# Patient Record
Sex: Female | Born: 1960 | Race: Black or African American | Hispanic: No | Marital: Single
Health system: Southern US, Community
[De-identification: ages and names within clinical notes are randomized; demographics above are authoritative.]

## PROBLEM LIST (undated history)

## (undated) DIAGNOSIS — I10 Essential (primary) hypertension: Secondary | ICD-10-CM

## (undated) DIAGNOSIS — J449 Chronic obstructive pulmonary disease, unspecified: Secondary | ICD-10-CM

## (undated) DIAGNOSIS — E7800 Pure hypercholesterolemia, unspecified: Secondary | ICD-10-CM

## (undated) HISTORY — PX: TOTAL HIP ARTHROPLASTY: SHX124

---

## 2017-02-13 ENCOUNTER — Other Ambulatory Visit: Payer: Self-pay

## 2017-02-13 ENCOUNTER — Encounter (HOSPITAL_COMMUNITY): Payer: Self-pay | Admitting: Emergency Medicine

## 2017-02-13 ENCOUNTER — Emergency Department (HOSPITAL_COMMUNITY): Payer: Medicaid - Out of State

## 2017-02-13 DIAGNOSIS — Z7982 Long term (current) use of aspirin: Secondary | ICD-10-CM | POA: Diagnosis not present

## 2017-02-13 DIAGNOSIS — Z79899 Other long term (current) drug therapy: Secondary | ICD-10-CM | POA: Insufficient documentation

## 2017-02-13 DIAGNOSIS — R072 Precordial pain: Secondary | ICD-10-CM | POA: Insufficient documentation

## 2017-02-13 DIAGNOSIS — J449 Chronic obstructive pulmonary disease, unspecified: Secondary | ICD-10-CM | POA: Diagnosis not present

## 2017-02-13 DIAGNOSIS — K210 Gastro-esophageal reflux disease with esophagitis: Secondary | ICD-10-CM | POA: Diagnosis not present

## 2017-02-13 DIAGNOSIS — I10 Essential (primary) hypertension: Secondary | ICD-10-CM | POA: Insufficient documentation

## 2017-02-13 LAB — BASIC METABOLIC PANEL
ANION GAP: 10 (ref 5–15)
BUN: 11 mg/dL (ref 6–20)
CALCIUM: 8.8 mg/dL — AB (ref 8.9–10.3)
CHLORIDE: 101 mmol/L (ref 101–111)
CO2: 26 mmol/L (ref 22–32)
Creatinine, Ser: 1.09 mg/dL — ABNORMAL HIGH (ref 0.44–1.00)
GFR calc non Af Amer: 55 mL/min — ABNORMAL LOW (ref >60)
Glucose, Bld: 123 mg/dL — ABNORMAL HIGH (ref 65–99)
Potassium: 4.1 mmol/L (ref 3.5–5.1)
Sodium: 137 mmol/L (ref 135–145)

## 2017-02-13 LAB — CBC
HCT: 39.5 % (ref 36.0–46.0)
HEMOGLOBIN: 12.7 g/dL (ref 12.0–15.0)
MCH: 26.8 pg (ref 26.0–34.0)
MCHC: 32.2 g/dL (ref 30.0–36.0)
MCV: 83.5 fL (ref 78.0–100.0)
Platelets: 311 10*3/uL (ref 150–400)
RBC: 4.73 MIL/uL (ref 3.87–5.11)
RDW: 14.4 % (ref 11.5–15.5)
WBC: 8.6 10*3/uL (ref 4.0–10.5)

## 2017-02-13 LAB — I-STAT TROPONIN, ED: TROPONIN I, POC: 0.01 ng/mL (ref 0.00–0.08)

## 2017-02-13 NOTE — ED Triage Notes (Signed)
Pt c/o mid chest pain onset earlier tonight.  Denies any nausea or vomiting.  St's felt like her heart was racing

## 2017-02-14 ENCOUNTER — Emergency Department (HOSPITAL_COMMUNITY)
Admission: EM | Admit: 2017-02-14 | Discharge: 2017-02-14 | Disposition: A | Discharge status: Home / Self Care | Payer: Medicaid - Out of State | Attending: Emergency Medicine | Admitting: Emergency Medicine

## 2017-02-14 DIAGNOSIS — F17200 Nicotine dependence, unspecified, uncomplicated: Secondary | ICD-10-CM

## 2017-02-14 DIAGNOSIS — K2100 Gastro-esophageal reflux disease with esophagitis, without bleeding: Secondary | ICD-10-CM

## 2017-02-14 DIAGNOSIS — R072 Precordial pain: Secondary | ICD-10-CM

## 2017-02-14 HISTORY — DX: Essential (primary) hypertension: I10

## 2017-02-14 HISTORY — DX: Chronic obstructive pulmonary disease, unspecified: J44.9

## 2017-02-14 HISTORY — DX: Pure hypercholesterolemia, unspecified: E78.00

## 2017-02-14 LAB — I-STAT TROPONIN, ED: Troponin i, poc: 0 ng/mL (ref 0.00–0.08)

## 2017-02-14 MED ORDER — IPRATROPIUM-ALBUTEROL 0.5-2.5 (3) MG/3ML IN SOLN
3.0000 mL | Freq: Once | RESPIRATORY_TRACT | Status: AC
  Administered 2017-02-14: 3 mL via RESPIRATORY_TRACT
  Filled 2017-02-14: qty 3

## 2017-02-14 MED ORDER — GI COCKTAIL ~~LOC~~
30.0000 mL | Freq: Once | ORAL | Status: AC
  Administered 2017-02-14: 30 mL via ORAL
  Filled 2017-02-14: qty 30

## 2017-02-14 MED ORDER — SUCRALFATE 1 G PO TABS: 1.0000 g | ORAL_TABLET | Freq: Three times a day (TID) | ORAL | 0 refills | Status: AC

## 2017-02-14 NOTE — ED Provider Notes (Signed)
[facility] EMERGENCY DEPARTMENT Provider Note   CSN: [id number] Arrival date & time: 02/13/17  2209     History   Chief Complaint Chief Complaint  Patient presents with  . Chest Pain    HPI Christina Blevins is a 57 y.o. female.  Christina Blevins is a 57 y.o. Female who presents to the ED complaining of chest pain beginning yesterday evening. Patient reports her pain began after eating a sandwich yesterday evening.  She reports her pain lasted for about a minute and a half and then resolved after drinking some water.  Her pain then came and went several times briefly.  She reports her pain was to the mid and right sided and went all the way down to her her stomach.  She reports a history of COPD and reports her breathing is around her baseline currently.  She also has history of hypertension, hyperlipidemia and chronic kidney disease.  She last took her lisinopril last night.  She does have a family history of MI, but no personal history of MI.  She is a smoker.  She reports she sees a cardiologist in [place] and had a cardiac cath 1-2 years ago that was normal. She is trying to quit smoking. She denies fevers, recent surgery, leg swelling, abdominal pain, nausea, vomiting, palpitations, SOB, pleuritic pain, pain on exertion.    The history is provided by the patient and medical records. No language interpreter was used.  Chest Pain   Pertinent negatives include no abdominal pain, no back pain, no cough, no fever, no headaches, no nausea, no palpitations, no shortness of breath, no vomiting and no weakness.    Past Medical History:  Diagnosis Date  . COPD (chronic obstructive pulmonary disease) (HCC)   . High cholesterol   . Hypertension     There are no active problems to display for this patient.   OB History    No data available       Home Medications    Prior to Admission medications   Medication Sig Start Date End Date Taking? Authorizing Provider    albuterol (PROVENTIL HFA;VENTOLIN HFA) 108 (90 Base) MCG/ACT inhaler Inhale 1-2 puffs into the lungs every 6 (six) hours as needed for wheezing or shortness of breath.   Yes [provider]  aspirin EC 81 MG tablet Take 81 mg by mouth daily.   Yes [provider]  atorvastatin (LIPITOR) 10 MG tablet Take 10 mg by mouth daily.   Yes [provider]  budesonide-formoterol (SYMBICORT) 80-4.5 MCG/ACT inhaler Inhale 2 puffs into the lungs 2 (two) times daily.   Yes [provider]  ibuprofen (ADVIL,MOTRIN) 200 MG tablet Take 200-600 mg by mouth every 6 (six) hours as needed for moderate pain.   Yes [provider]  lisinopril (PRINIVIL,ZESTRIL) 10 MG tablet Take 10 mg by mouth daily.   Yes [provider]  metFORMIN (GLUCOPHAGE) 500 MG tablet Take 500 mg by mouth daily with breakfast.   Yes [provider]  omeprazole (PRILOSEC) 20 MG capsule Take 20 mg by mouth daily.   Yes [provider]  PE-Diphenhydramine-DM-GG-APAP (MUCINEX FAST-MAX DAY/NIGHT PO) Take 2 tablets by mouth daily as needed (congestion).   Yes [provider]  traZODone (DESYREL) 50 MG tablet Take 50 mg by mouth at bedtime.   Yes [provider]  sucralfate (CARAFATE) 1 g tablet Take 1 tablet (1 g total) by mouth 4 (four) times daily -  with meals and at bedtime.  02/14/17   Everlene Farrier, PA-C    Family History No family history on file.  Social History Social History   Tobacco Use  . Smoking status: Not on file  Substance Use Topics  . Alcohol use: Not on file  . Drug use: Not on file     Allergies   Patient has no known allergies.   Review of Systems Review of Systems  Constitutional: Negative for chills and fever.  HENT: Negative for congestion and sore throat.   Eyes: Negative for visual disturbance.  Respiratory: Negative for cough, shortness of breath and wheezing.   Cardiovascular: Positive for chest pain. Negative for  palpitations and leg swelling.  Gastrointestinal: Negative for abdominal pain, diarrhea, nausea and vomiting.  Genitourinary: Negative for dysuria.  Musculoskeletal: Negative for back pain and neck pain.  Skin: Negative for rash.  Neurological: Negative for syncope, weakness, light-headedness and headaches.     Physical Exam Updated Vital Signs BP (!) 149/92 (BP Location: Right Arm) Comment: Simultaneous filing. User may not have seen previous data.  Pulse 87 Comment: Simultaneous filing. User may not have seen previous data.  Temp (!) 97.4 F (36.3 C) (Oral)   Resp 20   Ht 5\' 6"  (1.676 m)   Wt 111.1 kg (245 lb)   LMP  (Exact Date)   SpO2 100%   BMI 39.54 kg/m   Physical Exam  Constitutional: She appears well-developed and well-nourished.  Non-toxic appearance. She does not appear ill. No distress.  HENT:  Head: Normocephalic and atraumatic.  Mouth/Throat: Oropharynx is clear and moist.  Eyes: Conjunctivae are normal. Pupils are equal, round, and reactive to light. Right eye exhibits no discharge. Left eye exhibits no discharge.  Neck: Neck supple.  Cardiovascular: Normal rate, regular rhythm, normal heart sounds and intact distal pulses. Exam reveals no gallop and no friction rub.  No murmur heard. Pulses:      Radial pulses are 2+ on the right side, and 2+ on the left side.       Dorsalis pedis pulses are 2+ on the right side, and 2+ on the left side.       Posterior tibial pulses are 2+ on the right side, and 2+ on the left side.  Pulmonary/Chest: Effort normal. No respiratory distress. She has no wheezes. She has no rales.  Slightly diminished to bilateral bases.   Abdominal: Soft. There is no tenderness.  Musculoskeletal: She exhibits no edema.       Right lower leg: Normal. She exhibits no tenderness and no edema.       Left lower leg: Normal. She exhibits no tenderness and no edema.  Lymphadenopathy:    She has no cervical adenopathy.  Neurological: She is alert.  Coordination normal.  Skin: Skin is warm and dry. Capillary refill takes less than 2 seconds. No rash noted. She is not diaphoretic. No erythema. No pallor.  Psychiatric: She has a normal mood and affect. Her behavior is normal.  Nursing note and vitals reviewed.    ED Treatments / Results  Labs (all labs ordered are listed, but only abnormal results are displayed) Labs Reviewed  BASIC METABOLIC PANEL - Abnormal; Notable for the following components:      Result Value   Glucose, Bld 123 (*)    Creatinine, Ser 1.09 (*)    Calcium 8.8 (*)    GFR calc non Af Amer 55 (*)    All other components within normal limits  CBC  I-STAT TROPONIN, ED  I-STAT  TROPONIN, ED    EKG  EKG Interpretation  Date/Time:  Sunday February 13 2017 22:14:11 EST Ventricular Rate:  101 PR Interval:  146 QRS Duration: 78 QT Interval:  382 QTC Calculation: 495 R Axis:   73 Text Interpretation:  Sinus tachycardia Otherwise normal ECG Confirmed by Margarita Grizzle [id number]) on 02/14/2017 7:43:10 AM       Radiology Dg Chest 2 View  Result Date: 02/13/2017 CLINICAL DATA:  Chest pain and shortness of breath.  History COPD. EXAM: CHEST  2 VIEW COMPARISON:  None. FINDINGS: The cardiomediastinal contours are normal. Borderline hyperinflation. Mild bronchial thickening. Pulmonary vasculature is normal. No consolidation, pleural effusion, or pneumothorax. No acute osseous abnormalities are seen. IMPRESSION: Mild hyperinflation and bronchial thickening, consistent with stated history of COPD. No acute abnormality. Electronically Signed   By: Rubye Oaks M.D.   On: 02/13/2017 23:37    Procedures Procedures (including critical care time)  Medications Ordered in ED Medications  gi cocktail (Maalox,Lidocaine,Donnatal) (not administered)  ipratropium-albuterol (DUONEB) 0.5-2.5 (3) MG/3ML nebulizer solution 3 mL (3 mLs Nebulization Given 02/14/17 0733)     Initial Impression / Assessment and Plan / ED Course  I have  reviewed the triage vital signs and the nursing notes.  Pertinent labs & imaging results that were available during my care of the patient were reviewed by me and considered in my medical decision making (see chart for details).   The patient was counseled on the dangers of tobacco use, and was advised to quit.  Reviewed strategies to maximize success, including written materials.     This is a 57 y.o. Female who presents to the ED complaining of chest pain beginning yesterday evening. Patient reports her pain began after eating a sandwich yesterday evening.  She reports her pain lasted for about a minute and a half and then resolved after drinking some water.  Her pain then came and went several times briefly.  She reports her pain was to the mid and right sided and went all the way down to her her stomach.  She reports a history of COPD and reports her breathing is around her baseline currently.  She also has history of hypertension, hyperlipidemia and chronic kidney disease.  She last took her lisinopril last night.  She does have a family history of MI, but no personal history of MI.  She is a smoker.  She reports she sees a cardiologist in [place] and had a cardiac cath 1-2 years ago that was normal.  On exam the patient is afebrile and non-toxic appearing.  Lung sounds are just slightly diminished to bilateral bases.  History of COPD and smoking.  She has no hypoxia, tachypnea or tachycardia on my exam.  No lower extremity edema or tenderness.  Abdomen is soft nontender.  EKG is without evidence of STEMI. Patient reports feeling better currently and thinks this was related to reflux.  Patient waited for about 8 hours in the waiting room. Her initial troponin is not elevated.  BMP shows a creatinine of 1.09.  Patient has known chronic kidney disease.  CBC is unremarkable.  Chest x-ray shows COPD without acute disease.  As patient waited about 8 hours we obtained a second troponin.  8 hours later her  troponin is not elevated.  While the patient does have some risk factors she has had a rule out while waiting in the emergency department.  I will have low suspicion for ACS in this patient currently.  I doubt PE.  She reports feeling at her baseline.  She would like something for acid in her stomach.  She is been taking omeprazole.  We will give her Carafate.  I encouraged her to follow-up with her cardiologist for possible stress test.   I discussed smoking cessation with the patient.  I discussed strict and specific return precautions with the patient. I advised the patient to follow-up with their primary care provider this week. I advised the patient to return to the emergency department with new or worsening symptoms or new concerns. The patient verbalized understanding and agreement with plan.    This patient was discussed with Dr. Rosalia Hammersay who agrees with assessment and plan.   Final Clinical Impressions(s) / ED Diagnoses   Final diagnoses:  Precordial pain  Smoking  Gastroesophageal reflux disease with esophagitis    ED Discharge Orders        Ordered    sucralfate (CARAFATE) 1 g tablet  3 times daily with meals & bedtime     02/14/17 0822       Everlene Farrieransie, Landon Bassford, PA-C 02/14/17 0831    Margarita Grizzleay, Danielle, MD 02/15/17 [phone]

## 2019-05-31 IMAGING — CR DG CHEST 2V
2 series · 2 of 2 positions shown · non-contrast
Comparison: None.

CLINICAL DATA: Chest pain and shortness of breath.  History COPD.

EXAM:
CHEST  2 VIEW

[chest pa]
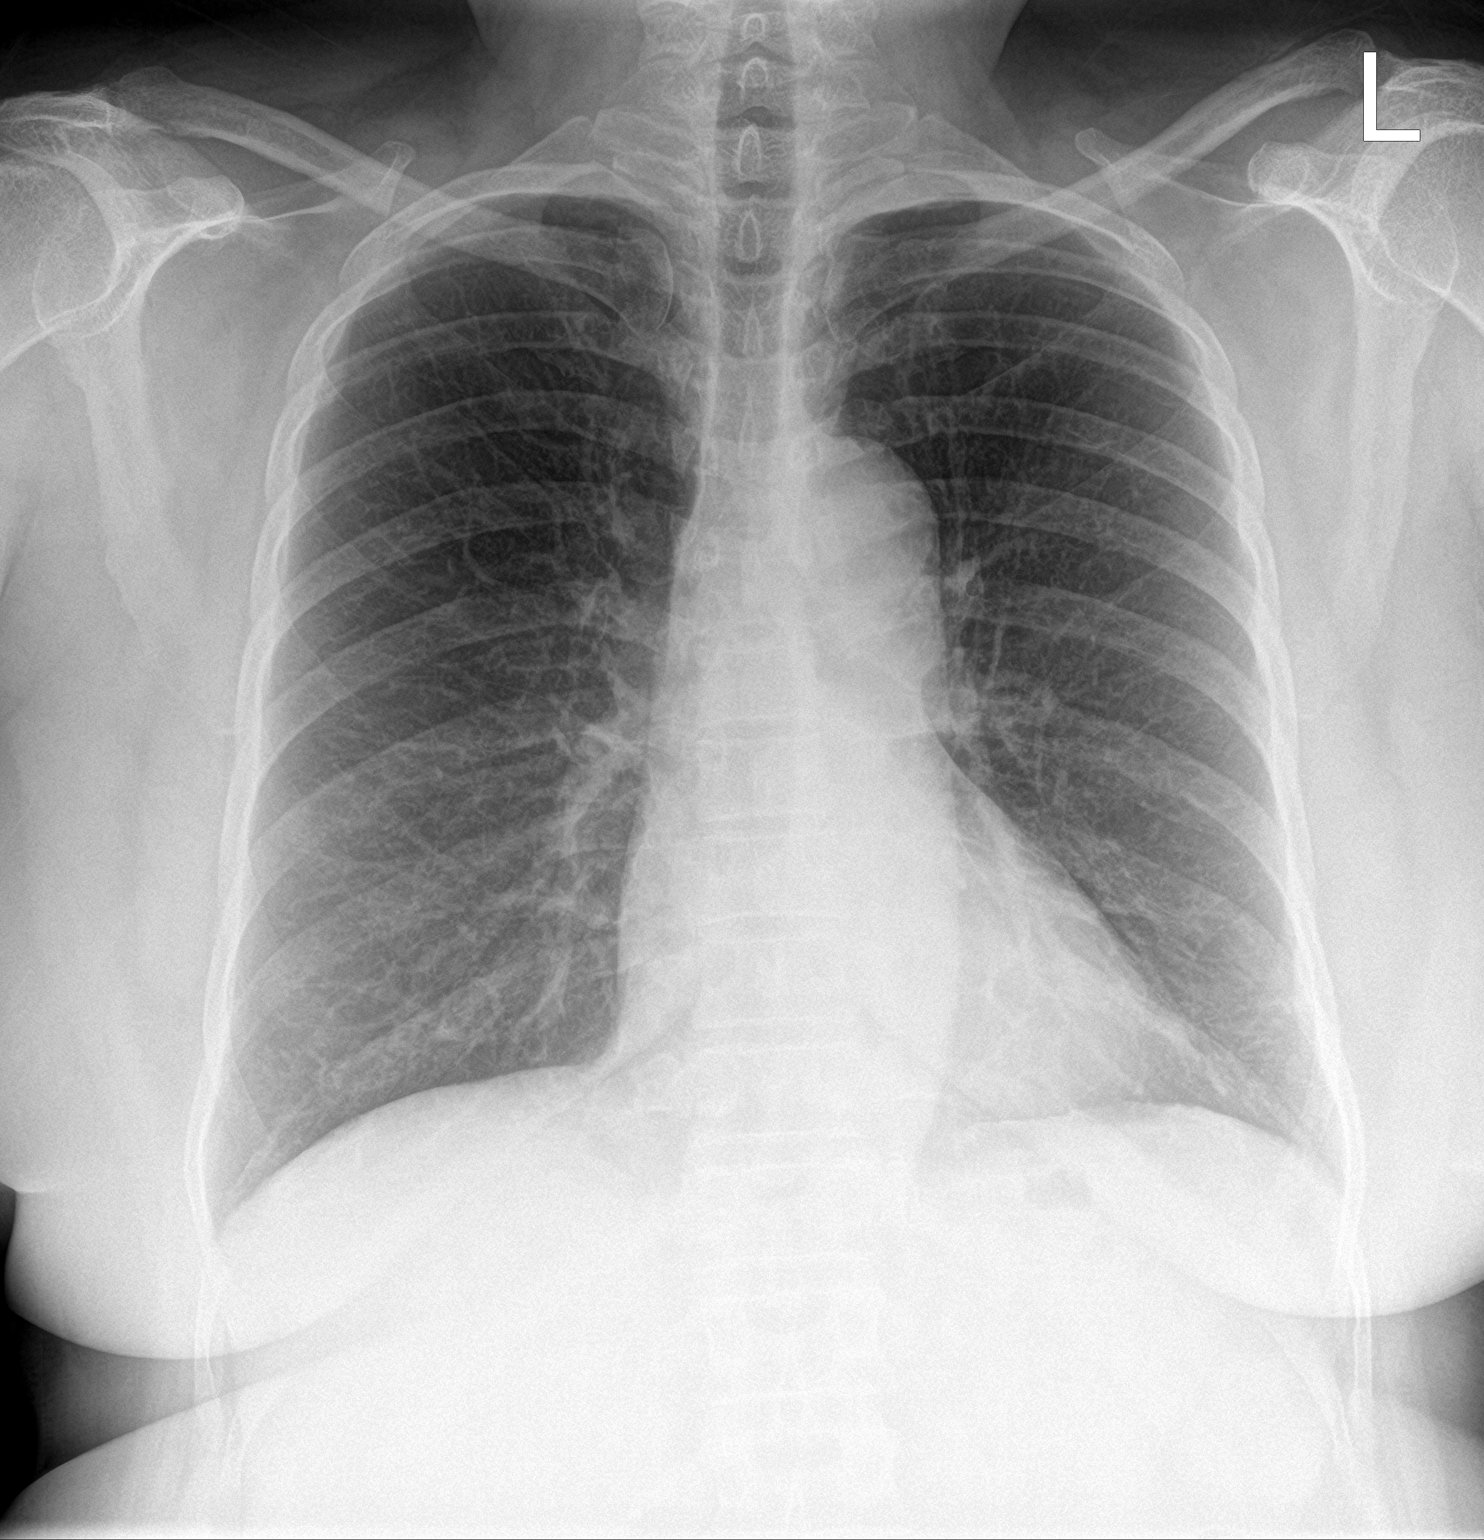

[chest lat]
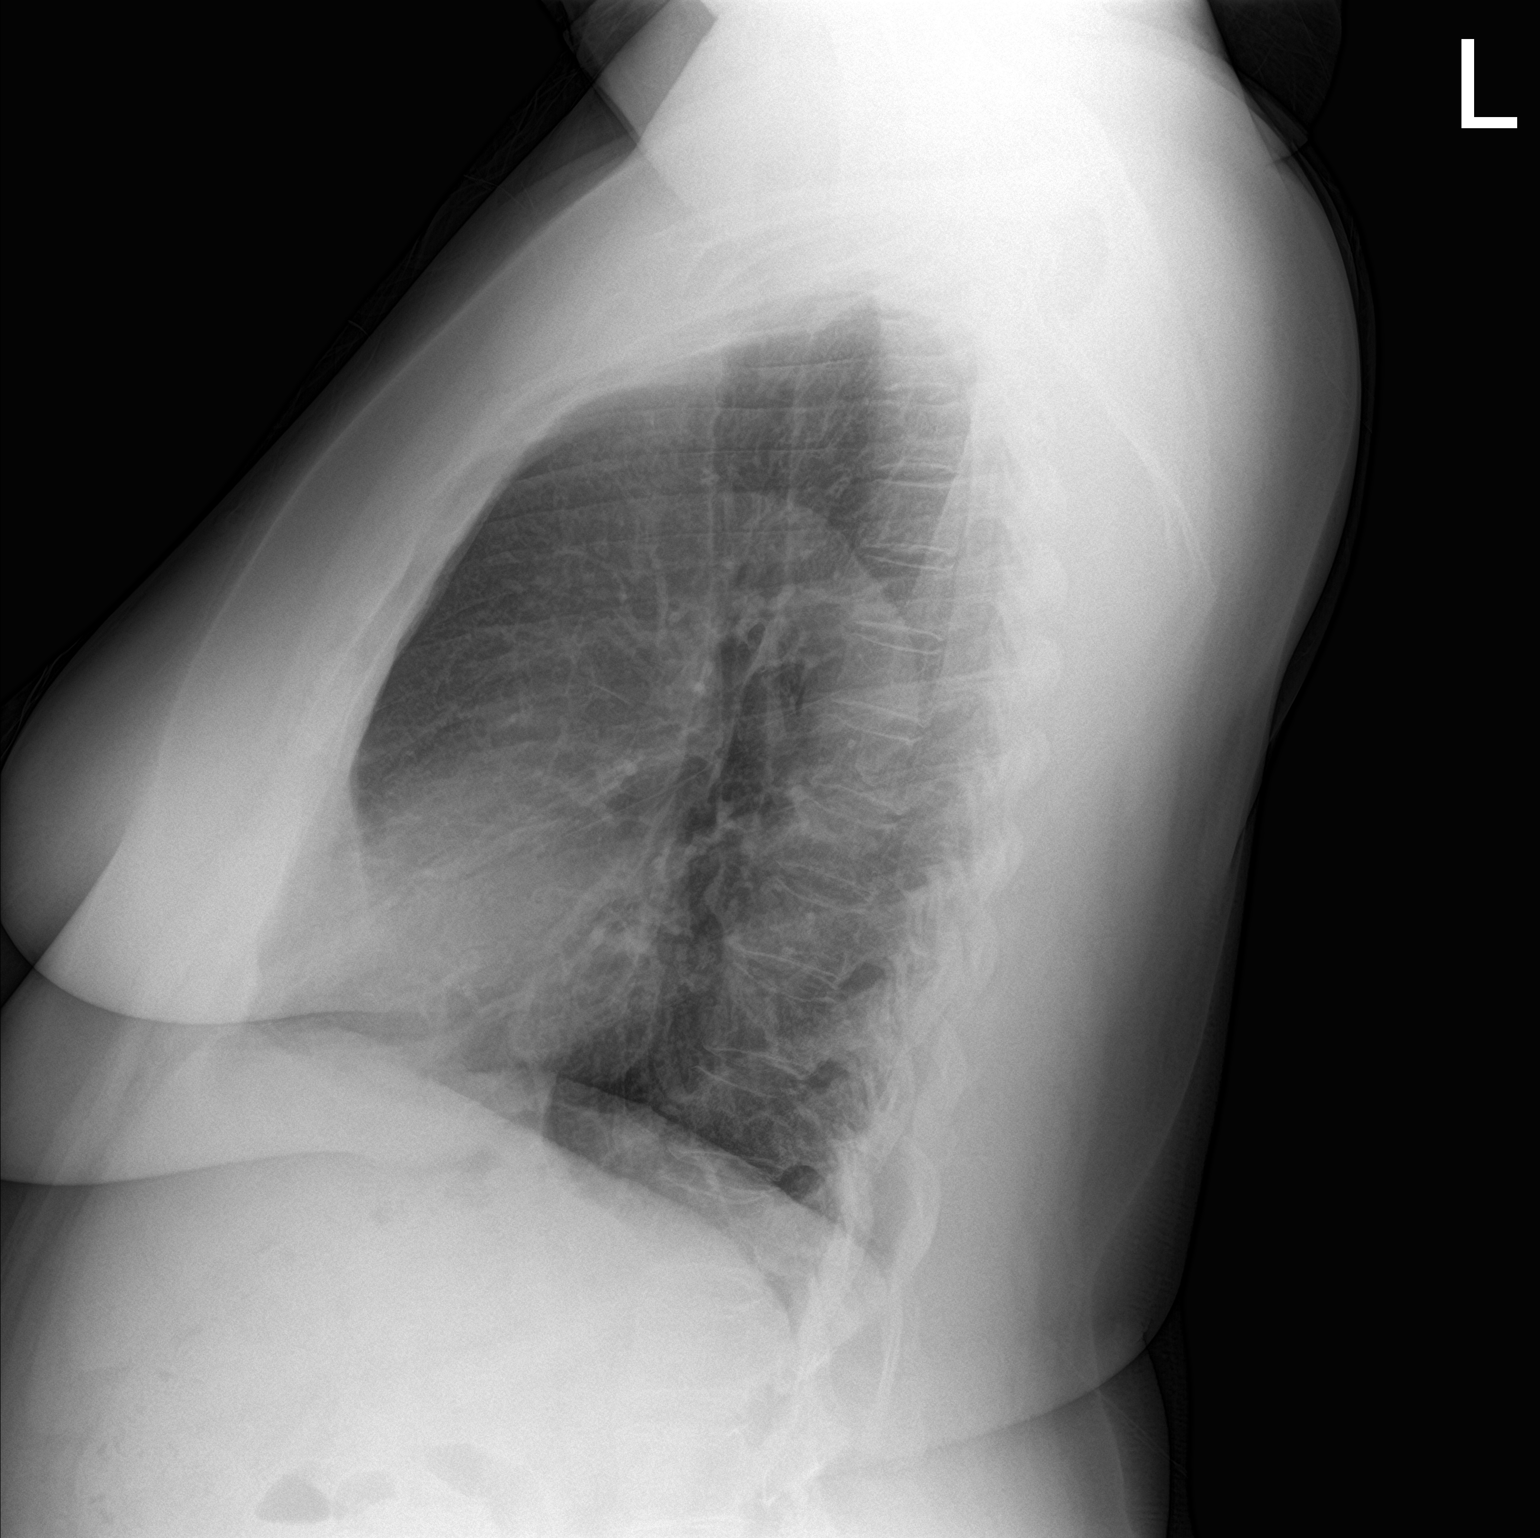

[2 of 2 positions shown; findings below may reference images not displayed]

FINDINGS: The cardiomediastinal contours are normal. Borderline
hyperinflation. Mild bronchial thickening. Pulmonary vasculature is
normal. No consolidation, pleural effusion, or pneumothorax. No
acute osseous abnormalities are seen.
IMPRESSION: Mild hyperinflation and bronchial thickening, consistent with stated
history of COPD. No acute abnormality.
